# Patient Record
Sex: Female | Born: 1975 | Smoking: Never smoker
Health system: Southern US, Community
[De-identification: ages and names within clinical notes are randomized; demographics above are authoritative.]

## PROBLEM LIST (undated history)

## (undated) DIAGNOSIS — U071 COVID-19: Secondary | ICD-10-CM

---

## 2016-11-30 DIAGNOSIS — Z13220 Encounter for screening for lipoid disorders: Secondary | ICD-10-CM | POA: Diagnosis not present

## 2016-11-30 DIAGNOSIS — Z0000 Encounter for general adult medical examination without abnormal findings: Secondary | ICD-10-CM | POA: Diagnosis not present

## 2016-12-05 ENCOUNTER — Other Ambulatory Visit (HOSPITAL_COMMUNITY)
Admission: RE | Admit: 2016-12-05 | Discharge: 2016-12-05 | Disposition: A | Discharge status: Home / Self Care | Payer: 59 | Source: Ambulatory Visit | Attending: Family Medicine | Admitting: Family Medicine

## 2016-12-05 ENCOUNTER — Other Ambulatory Visit: Payer: Self-pay | Admitting: Family Medicine

## 2016-12-05 DIAGNOSIS — Z01419 Encounter for gynecological examination (general) (routine) without abnormal findings: Secondary | ICD-10-CM | POA: Insufficient documentation

## 2016-12-05 DIAGNOSIS — Z1231 Encounter for screening mammogram for malignant neoplasm of breast: Secondary | ICD-10-CM | POA: Diagnosis not present

## 2016-12-06 LAB — CYTOLOGY - PAP: DIAGNOSIS: NEGATIVE

## 2016-12-07 DIAGNOSIS — R921 Mammographic calcification found on diagnostic imaging of breast: Secondary | ICD-10-CM | POA: Diagnosis not present

## 2017-06-11 DIAGNOSIS — R921 Mammographic calcification found on diagnostic imaging of breast: Secondary | ICD-10-CM | POA: Diagnosis not present

## 2017-12-02 ENCOUNTER — Other Ambulatory Visit: Payer: Self-pay | Admitting: Family Medicine

## 2017-12-02 ENCOUNTER — Other Ambulatory Visit (HOSPITAL_COMMUNITY)
Admission: RE | Admit: 2017-12-02 | Discharge: 2017-12-02 | Disposition: A | Discharge status: Home / Self Care | Payer: 59 | Source: Ambulatory Visit | Attending: Family Medicine | Admitting: Family Medicine

## 2017-12-02 DIAGNOSIS — Z0000 Encounter for general adult medical examination without abnormal findings: Secondary | ICD-10-CM | POA: Diagnosis present

## 2017-12-02 DIAGNOSIS — Z13220 Encounter for screening for lipoid disorders: Secondary | ICD-10-CM | POA: Diagnosis not present

## 2017-12-03 LAB — CYTOLOGY - PAP: DIAGNOSIS: NEGATIVE

## 2017-12-17 DIAGNOSIS — R921 Mammographic calcification found on diagnostic imaging of breast: Secondary | ICD-10-CM | POA: Diagnosis not present

## 2018-03-13 DIAGNOSIS — N390 Urinary tract infection, site not specified: Secondary | ICD-10-CM | POA: Diagnosis not present

## 2018-03-13 DIAGNOSIS — R300 Dysuria: Secondary | ICD-10-CM | POA: Diagnosis not present

## 2018-03-20 DIAGNOSIS — T8332XA Displacement of intrauterine contraceptive device, initial encounter: Secondary | ICD-10-CM | POA: Diagnosis not present

## 2018-03-20 DIAGNOSIS — Z30431 Encounter for routine checking of intrauterine contraceptive device: Secondary | ICD-10-CM | POA: Diagnosis not present

## 2018-04-29 DIAGNOSIS — T8332XA Displacement of intrauterine contraceptive device, initial encounter: Secondary | ICD-10-CM | POA: Diagnosis not present

## 2018-06-19 DIAGNOSIS — R921 Mammographic calcification found on diagnostic imaging of breast: Secondary | ICD-10-CM | POA: Diagnosis not present

## 2018-06-19 DIAGNOSIS — R922 Inconclusive mammogram: Secondary | ICD-10-CM | POA: Diagnosis not present

## 2018-07-10 DIAGNOSIS — M542 Cervicalgia: Secondary | ICD-10-CM | POA: Diagnosis not present

## 2018-07-10 DIAGNOSIS — M25462 Effusion, left knee: Secondary | ICD-10-CM | POA: Diagnosis not present

## 2018-07-10 DIAGNOSIS — M25562 Pain in left knee: Secondary | ICD-10-CM | POA: Diagnosis not present

## 2018-12-05 ENCOUNTER — Other Ambulatory Visit (HOSPITAL_COMMUNITY)
Admission: RE | Admit: 2018-12-05 | Discharge: 2018-12-05 | Disposition: A | Discharge status: Home / Self Care | Payer: 59 | Source: Ambulatory Visit | Attending: Family Medicine | Admitting: Family Medicine

## 2018-12-05 ENCOUNTER — Other Ambulatory Visit: Payer: Self-pay | Admitting: Family Medicine

## 2018-12-05 DIAGNOSIS — Z0000 Encounter for general adult medical examination without abnormal findings: Secondary | ICD-10-CM | POA: Diagnosis present

## 2018-12-08 LAB — CYTOLOGY - PAP: Diagnosis: NEGATIVE

## 2018-12-09 DIAGNOSIS — Z1231 Encounter for screening mammogram for malignant neoplasm of breast: Secondary | ICD-10-CM | POA: Diagnosis not present

## 2019-09-01 ENCOUNTER — Ambulatory Visit (HOSPITAL_COMMUNITY)
Admission: RE | Admit: 2019-09-01 | Discharge: 2019-09-01 | Disposition: A | Discharge status: Home / Self Care | Payer: 59 | Source: Ambulatory Visit | Attending: Physician Assistant | Admitting: Physician Assistant

## 2019-09-01 ENCOUNTER — Other Ambulatory Visit (HOSPITAL_COMMUNITY): Payer: Self-pay

## 2019-09-01 ENCOUNTER — Other Ambulatory Visit: Payer: Self-pay

## 2019-09-01 DIAGNOSIS — U071 COVID-19: Secondary | ICD-10-CM | POA: Diagnosis present

## 2019-09-05 ENCOUNTER — Ambulatory Visit (INDEPENDENT_AMBULATORY_CARE_PROVIDER_SITE_OTHER): Payer: 59

## 2019-09-05 ENCOUNTER — Encounter (HOSPITAL_COMMUNITY): Payer: Self-pay | Admitting: *Deleted

## 2019-09-05 ENCOUNTER — Other Ambulatory Visit: Payer: Self-pay

## 2019-09-05 ENCOUNTER — Ambulatory Visit (HOSPITAL_COMMUNITY)
Admission: EM | Admit: 2019-09-05 | Discharge: 2019-09-05 | Disposition: A | Discharge status: Home / Self Care | Payer: 59

## 2019-09-05 DIAGNOSIS — R509 Fever, unspecified: Secondary | ICD-10-CM

## 2019-09-05 DIAGNOSIS — U071 COVID-19: Secondary | ICD-10-CM

## 2019-09-05 DIAGNOSIS — R5081 Fever presenting with conditions classified elsewhere: Secondary | ICD-10-CM

## 2019-09-05 HISTORY — DX: COVID-19: U07.1

## 2019-09-05 MED ORDER — FLUTICASONE PROPIONATE 50 MCG/ACT NA SUSP: 1.0000 | Freq: Every day | NASAL | 0 refills | Status: AC

## 2019-09-05 MED ORDER — BENZONATATE 100 MG PO CAPS: 100.0000 mg | ORAL_CAPSULE | Freq: Three times a day (TID) | ORAL | 0 refills | Status: AC

## 2019-09-05 NOTE — Discharge Instructions (Addendum)
Chest X-ray is negative You should remain isolated in your home for 10 days from symptom onset AND greater than 72 hours after symptoms resolution (absence of fever without the use of fever-reducing medication and improvement in respiratory symptoms), whichever is longer Get plenty of rest and push fluids Use flonase for nasal congestion and runny nose Use medications daily for symptom relief Use OTC medications like ibuprofen or tylenol as needed fever or pain Follow up with PCP in 1-2 days via phone or e-visit for recheck and to ensure symptoms are improving Call 911 or go to the ED if you have any new or worsening symptoms such as fever, worsening cough, shortness of breath, chest tightness, chest pain, turning blue, changes in mental status, etc..Marland Kitchen

## 2019-09-05 NOTE — ED Provider Notes (Signed)
[facility]    CSN: [id number] Arrival date & time: 09/05/19  1005      History   Chief Complaint Chief Complaint  Patient presents with  . Fever  . Covid +    HPI Christie Franco is a 43 y.o. female.   Presented to the urgent care for complaint of fever and positive COVID-19 infection.  Husband tested positive for COVID-19 on 11/21.  She is here today with her son which have been having same symptoms and was tested yesterday.  Reported fever of 102 last night. She has been coughing with no improvement and associated chest tightness.Denied sob, nausea, vomiting, diarrhea,  and chest pain    The history is provided by the patient. No language interpreter was used.  Fever Associated symptoms: cough     Past Medical History:  Diagnosis Date  . COVID-19     There are no active problems to display for this patient.   History reviewed. No pertinent surgical history.  OB History   No obstetric history on file.      Home Medications    Prior to Admission medications   Medication Sig Start Date End Date Taking? Authorizing Provider  Acetaminophen (TYLENOL PO) Take by mouth.   Yes [provider]  benzonatate (TESSALON) 100 MG capsule Take 1 capsule (100 mg total) by mouth every 8 (eight) hours. 09/05/19   Ameenah Prosser, Darrelyn Hillock, FNP  fluticasone (FLONASE) 50 MCG/ACT nasal spray Place 1 spray into both nostrils daily for 14 days. 09/05/19 09/19/19  Emerson Monte, FNP    Family History Family History  Problem Relation Age of Onset  . Diabetes Mother   . Diabetes Father     Social History Social History   Tobacco Use  . Smoking status: Never Smoker  . Smokeless tobacco: Never Used  Substance Use Topics  . Alcohol use: Not Currently    Frequency: Never  . Drug use: Never     Allergies   Patient has no known allergies.   Review of Systems Review of Systems  Constitutional: Positive for fever.  HENT: Negative.   Respiratory: Positive  for cough and chest tightness.   Cardiovascular: Negative.   ROS: All other are negatives   Physical Exam Triage Vital Signs ED Triage Vitals  Enc Vitals Group     BP 09/05/19 1024 107/73     Pulse Rate 09/05/19 1024 93     Resp 09/05/19 1024 18     Temp 09/05/19 1024 99.5 F (37.5 C)     Temp Source 09/05/19 1024 Oral     SpO2 09/05/19 1024 96 %     Weight --      Height --      Head Circumference --      Peak Flow --      Pain Score 09/05/19 1025 6     Pain Loc --      Pain Edu? --      Excl. in [place]? --    No data found.  Updated Vital Signs BP 107/73   Pulse 93   Temp 99.5 F (37.5 C) (Oral)   Resp 18   LMP 08/22/2019 (Approximate)   SpO2 96%   Visual Acuity Right Eye Distance:   Left Eye Distance:   Bilateral Distance:    Right Eye Near:   Left Eye Near:    Bilateral Near:     Physical Exam Constitutional:      General: She is not in  acute distress.    Appearance: Normal appearance.  HENT:     Head: Normocephalic.     Right Ear: Tympanic membrane, ear canal and external ear normal. There is no impacted cerumen.     Left Ear: Tympanic membrane, ear canal and external ear normal. There is no impacted cerumen.  Cardiovascular:     Rate and Rhythm: Normal rate and regular rhythm.     Pulses: Normal pulses.     Heart sounds: Normal heart sounds. No murmur.  Pulmonary:     Effort: Pulmonary effort is normal. No respiratory distress.     Breath sounds: Normal breath sounds. No wheezing or rales.  Chest:     Chest wall: No tenderness.  Neurological:     Mental Status: She is alert and oriented to person, place, and time.      UC Treatments / Results  Labs (all labs ordered are listed, but only abnormal results are displayed) Labs Reviewed - No data to display  EKG   Radiology Dg Chest 2 View  Result Date: 09/05/2019 CLINICAL DATA:  Chest pain x2 days, COVID positive EXAM: CHEST - 2 VIEW COMPARISON:  09/01/2019 FINDINGS: Lungs are clear.  No  pleural effusion or pneumothorax. The heart is normal in size. Visualized osseous structures are within normal limits. IMPRESSION: Normal chest radiographs. Electronically Signed   By: Charline Bills M.D.   On: 09/05/2019 11:41    Procedures Procedures (including critical care time)  Medications Ordered in UC Medications - No data to display  Initial Impression / Assessment and Plan / UC Course  I have reviewed the triage vital signs and the nursing notes.  Pertinent labs & imaging results that were available during my care of the patient were reviewed by me and considered in my medical decision making (see chart for details).  Clinical Course as of Sep 04 1154  Sat Sep 05, 2019  1153 DG Chest 2 View [KA]    Clinical Course User Index [KA] Durward Parcel, FNP  Patient has a positive COVID test. Non toxic. Chest X-ray was completed and negative.  Benign physical exam.  Patient is  stable at this time. Afebrile here today.  Final Clinical Impressions(s) / UC Diagnoses   Final diagnoses:  COVID-19 virus infection  Fever due to COVID-19     Discharge Instructions     Chest X-ray is negative You should remain isolated in your home for 10 days from symptom onset AND greater than 72 hours after symptoms resolution (absence of fever without the use of fever-reducing medication and improvement in respiratory symptoms), whichever is longer Get plenty of rest and push fluids Use flonase for nasal congestion and runny nose Use medications daily for symptom relief Use OTC medications like ibuprofen or tylenol as needed fever or pain Follow up with PCP in 1-2 days via phone or e-visit for recheck and to ensure symptoms are improving Call 911 or go to the ED if you have any new or worsening symptoms such as fever, worsening cough, shortness of breath, chest tightness, chest pain, turning blue, changes in mental status, etc...     ED Prescriptions    Medication Sig Dispense Auth.  Provider   benzonatate (TESSALON) 100 MG capsule Take 1 capsule (100 mg total) by mouth every 8 (eight) hours. 21 capsule Raevin Wierenga S, FNP   fluticasone (FLONASE) 50 MCG/ACT nasal spray Place 1 spray into both nostrils daily for 14 days. 16 g Durward Parcel, FNP  PDMP not reviewed this encounter.   Durward Parcelvegno, Cheyne Bungert S, FNP 09/05/19 1155

## 2019-09-05 NOTE — ED Triage Notes (Signed)
Pt Covid + as of 1 wk ago per pt. Reports fever 102.7 starting last night. Had been having HA which resolved, now returned.  Also c/o cough with chest congestion and painful breathing.

## 2019-12-08 ENCOUNTER — Other Ambulatory Visit (HOSPITAL_COMMUNITY)
Admission: RE | Admit: 2019-12-08 | Discharge: 2019-12-08 | Disposition: A | Discharge status: Home / Self Care | Payer: 59 | Source: Ambulatory Visit | Attending: Family Medicine | Admitting: Family Medicine

## 2019-12-08 ENCOUNTER — Other Ambulatory Visit: Payer: Self-pay | Admitting: Family Medicine

## 2019-12-08 DIAGNOSIS — Z0000 Encounter for general adult medical examination without abnormal findings: Secondary | ICD-10-CM | POA: Insufficient documentation

## 2019-12-10 LAB — CYTOLOGY - PAP: Diagnosis: NEGATIVE

## 2021-01-25 ENCOUNTER — Other Ambulatory Visit: Payer: Self-pay | Admitting: Family Medicine

## 2021-01-25 ENCOUNTER — Other Ambulatory Visit (HOSPITAL_COMMUNITY)
Admission: RE | Admit: 2021-01-25 | Discharge: 2021-01-25 | Disposition: A | Discharge status: Home / Self Care | Payer: 59 | Source: Ambulatory Visit | Attending: Family Medicine | Admitting: Family Medicine

## 2021-01-25 DIAGNOSIS — Z0000 Encounter for general adult medical examination without abnormal findings: Secondary | ICD-10-CM | POA: Diagnosis present

## 2021-01-27 LAB — CYTOLOGY - PAP
Comment: NEGATIVE
Diagnosis: NEGATIVE
High risk HPV: NEGATIVE

## 2021-07-02 IMAGING — DX DG CHEST 2V
2 series · 2 of 2 positions shown · non-contrast
Comparison: None.

CLINICAL DATA: Chest pain for 1 week.  [id number] virus infection.

EXAM:
CHEST - 2 VIEW

[chest pa]
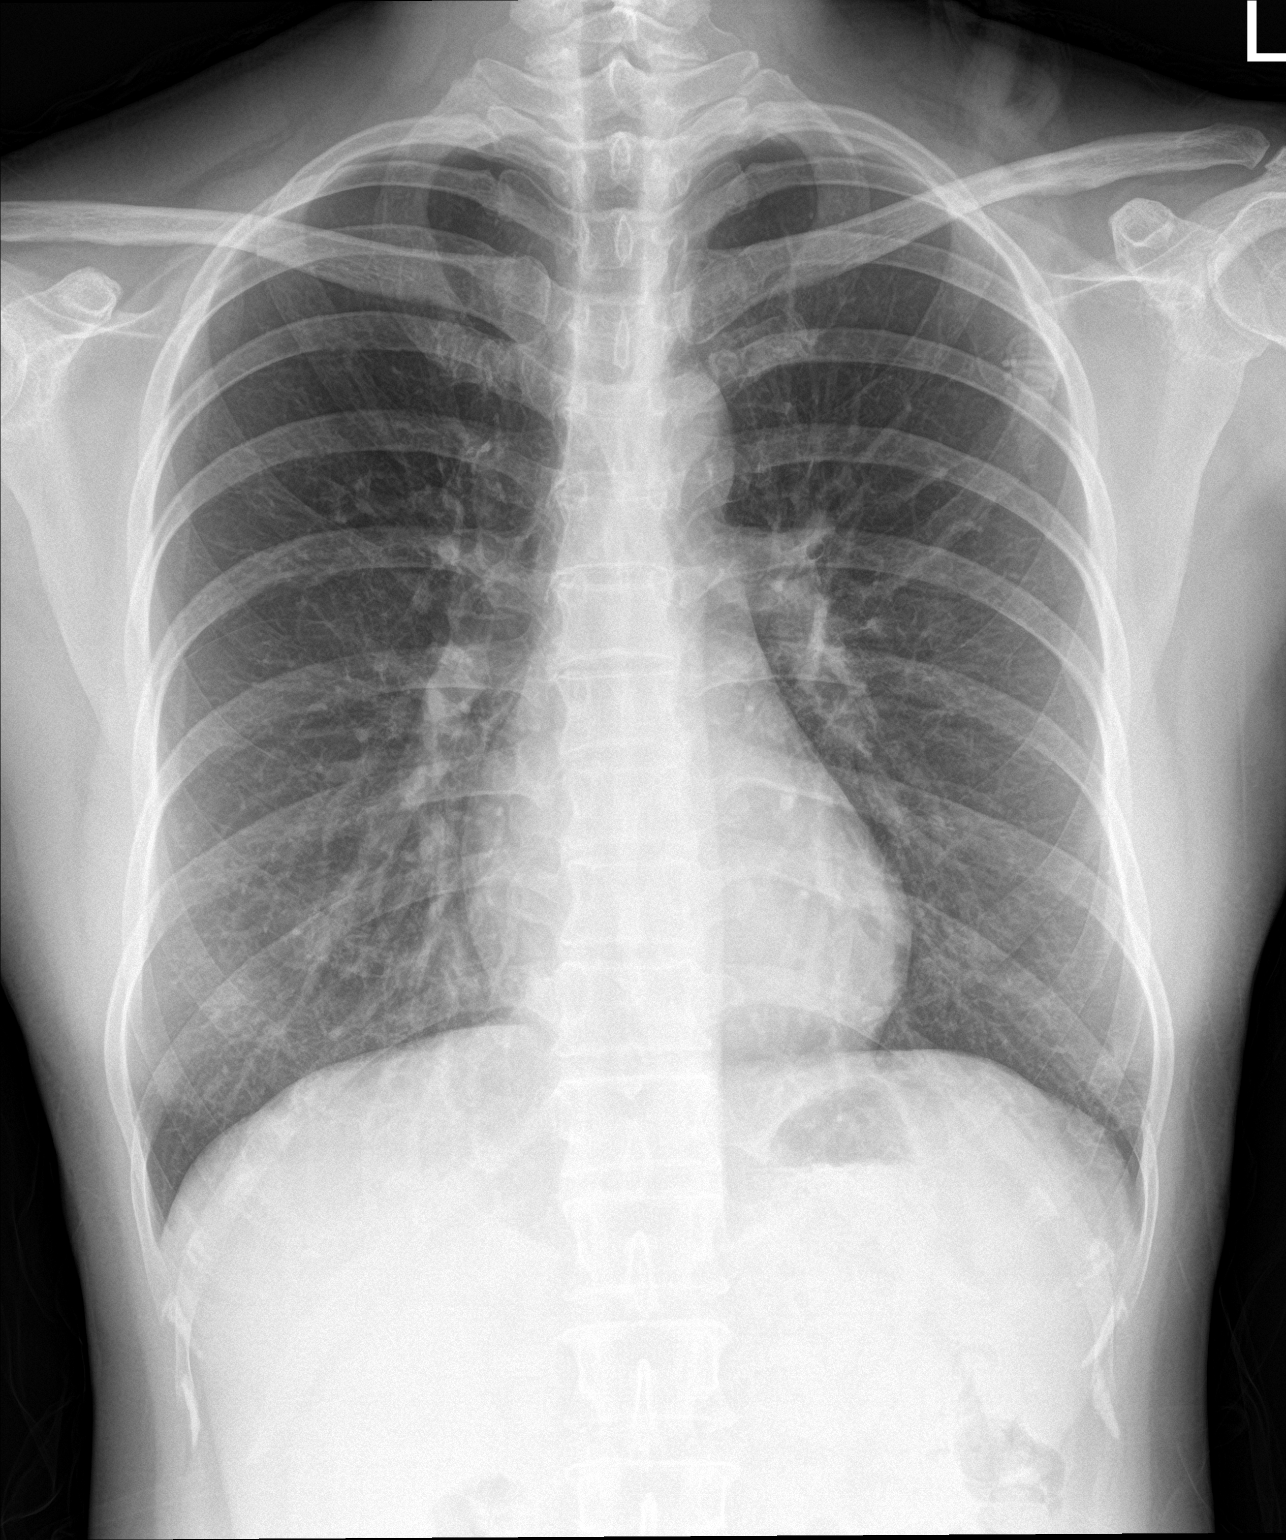

[chest lat]
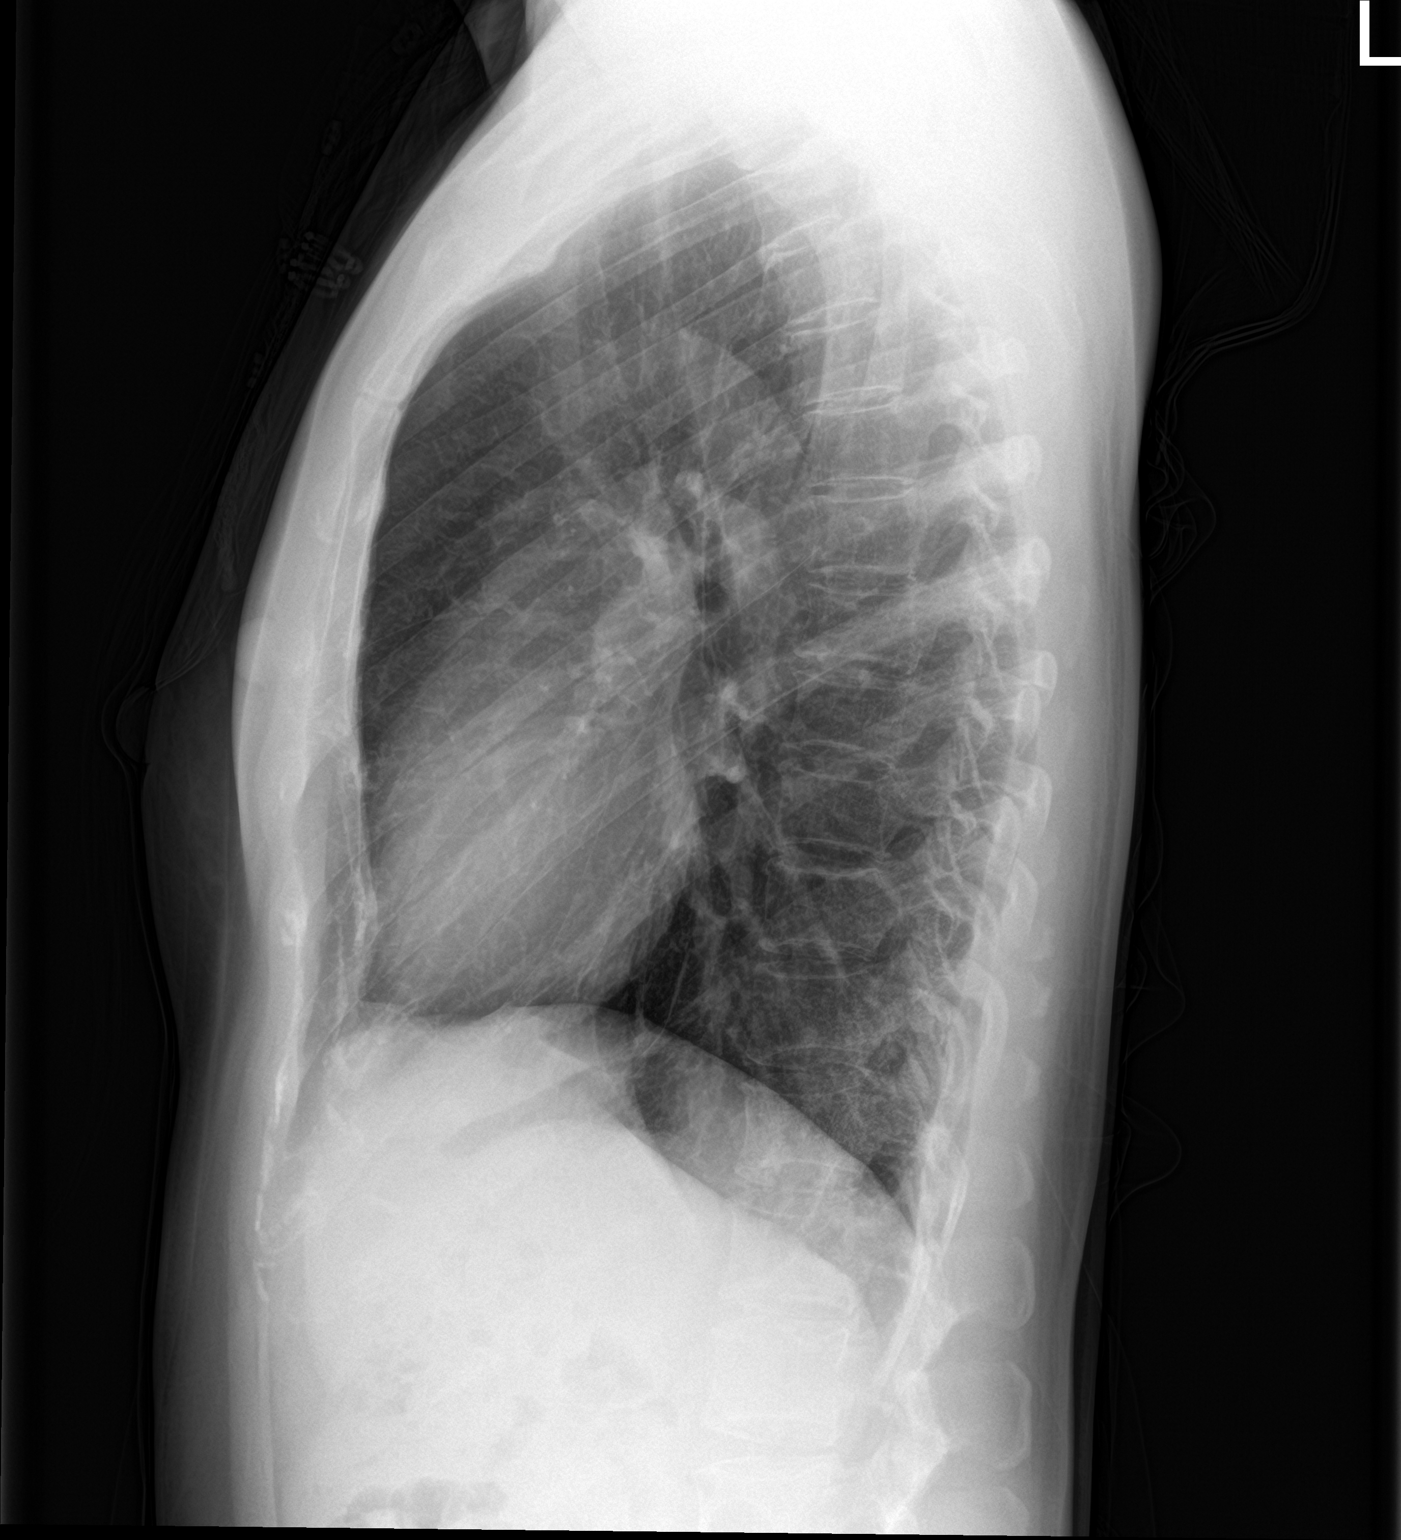

[2 of 2 positions shown; findings below may reference images not displayed]

FINDINGS: The heart size and mediastinal contours are within normal limits.
Both lungs are clear. The visualized skeletal structures are
unremarkable.
IMPRESSION: No active cardiopulmonary disease.

## 2021-07-06 IMAGING — DX DG CHEST 2V
2 series · 2 of 2 positions shown · non-contrast
Comparison: 09/01/2019

CLINICAL DATA: Chest pain x2 days, COVID positive

EXAM:
CHEST - 2 VIEW

[chest pa]
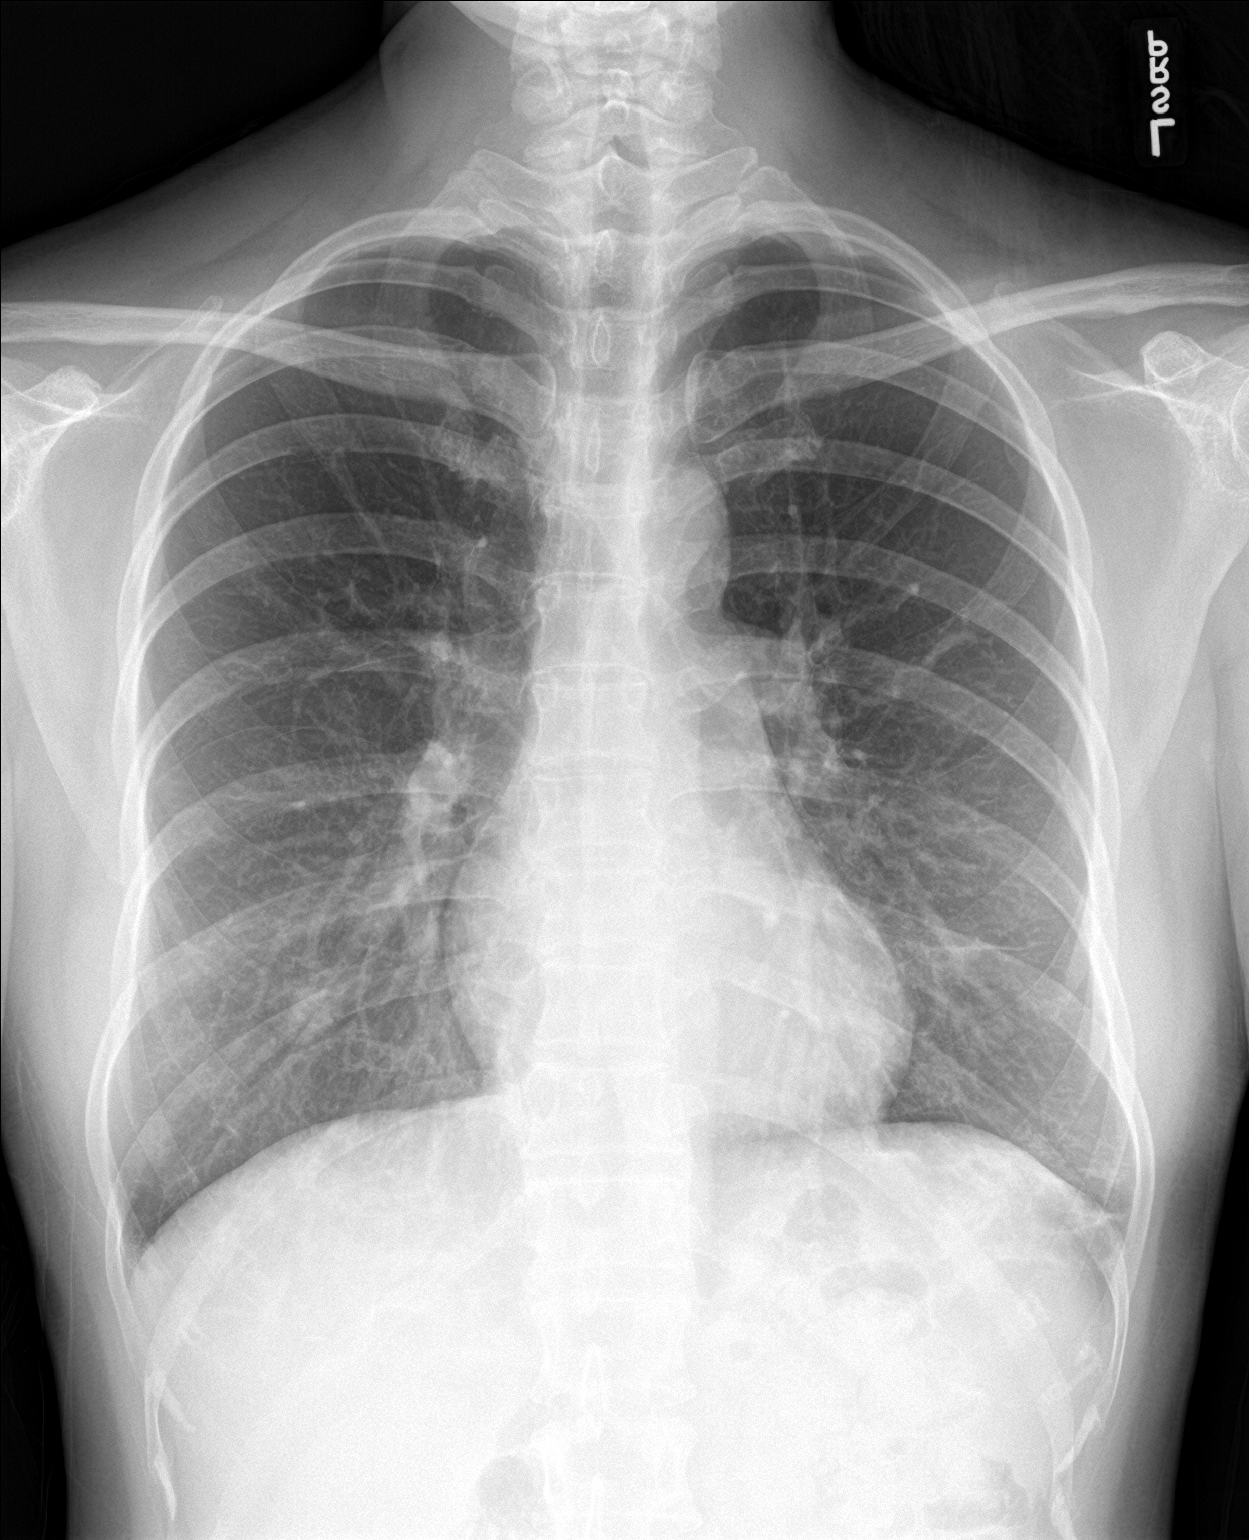

[chest lat]
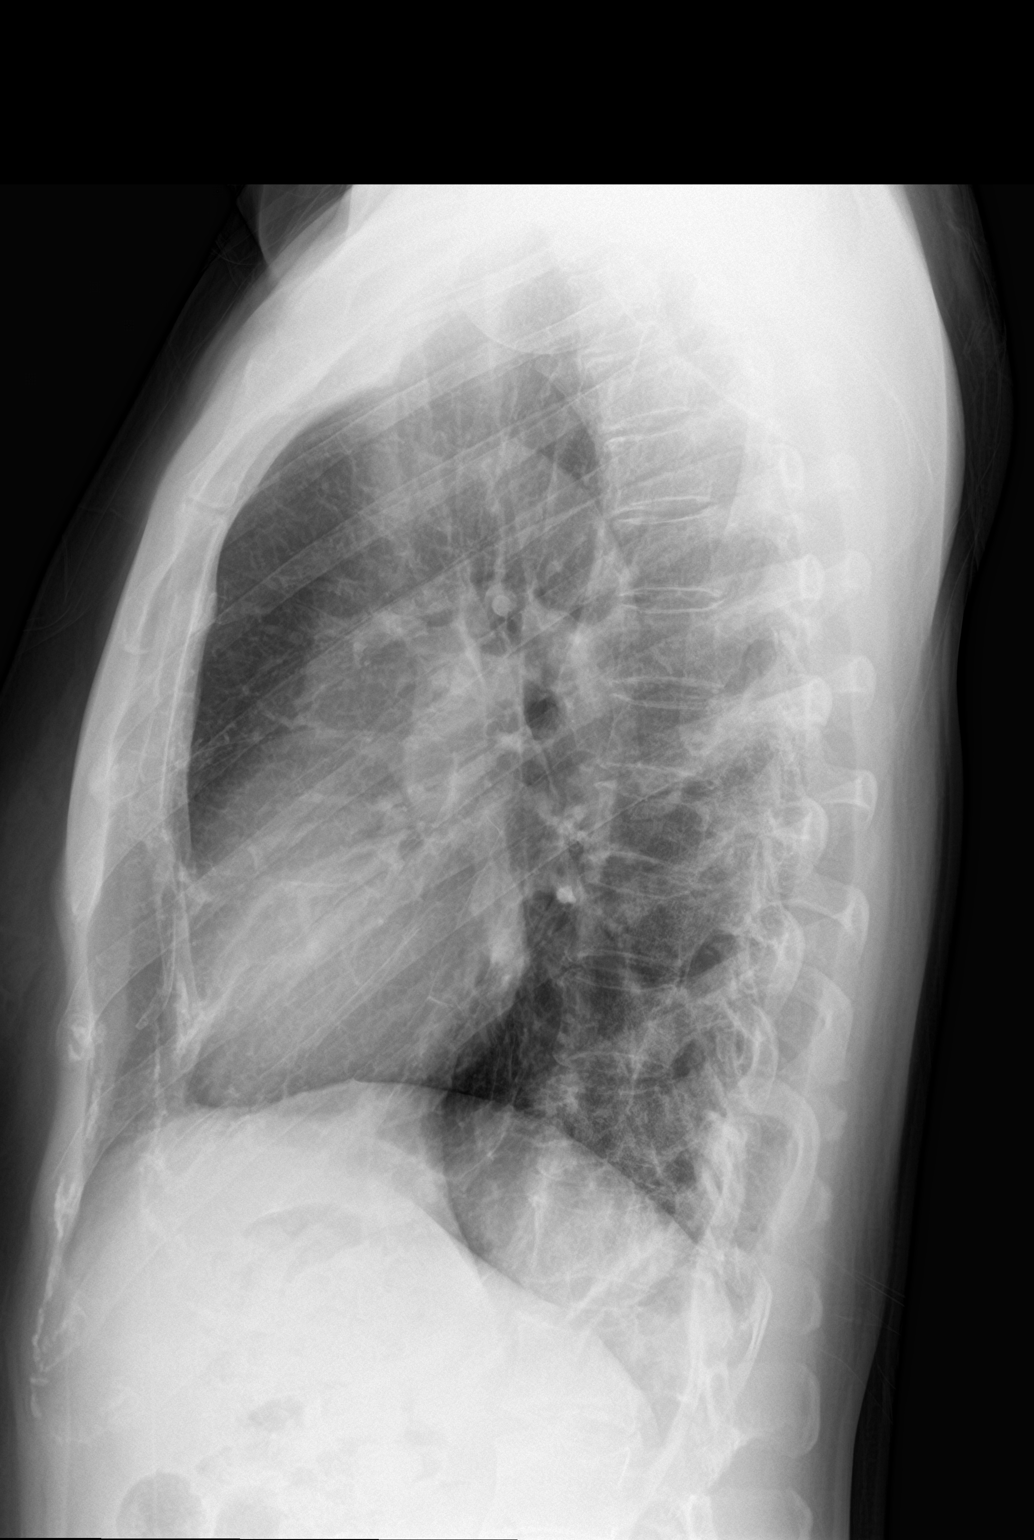

[2 of 2 positions shown; findings below may reference images not displayed]

FINDINGS: Lungs are clear.  No pleural effusion or pneumothorax.

The heart is normal in size.

Visualized osseous structures are within normal limits.
IMPRESSION: Normal chest radiographs.
